# Patient Record
Sex: Female | Born: 1970 | Race: White | Hispanic: No | Marital: Married | Smoking: Never smoker
Health system: Southern US, Community
[De-identification: ages and names within clinical notes are randomized; demographics above are authoritative.]

## PROBLEM LIST (undated history)

## (undated) HISTORY — PX: TUBAL LIGATION: SHX77

---

## 1998-05-18 ENCOUNTER — Ambulatory Visit (HOSPITAL_COMMUNITY): Admission: RE | Admit: 1998-05-18 | Discharge: 1998-05-18 | Payer: Self-pay | Admitting: Obstetrics and Gynecology

## 1998-08-22 ENCOUNTER — Inpatient Hospital Stay (HOSPITAL_COMMUNITY): Admission: AD | Admit: 1998-08-22 | Discharge: 1998-08-22 | Payer: Self-pay | Admitting: Obstetrics and Gynecology

## 1998-08-23 ENCOUNTER — Inpatient Hospital Stay (HOSPITAL_COMMUNITY): Admission: AD | Admit: 1998-08-23 | Discharge: 1998-08-26 | Payer: Self-pay | Admitting: Obstetrics & Gynecology

## 1998-10-19 ENCOUNTER — Other Ambulatory Visit: Admission: RE | Admit: 1998-10-19 | Discharge: 1998-10-19 | Payer: Self-pay | Admitting: Obstetrics and Gynecology

## 2001-10-26 ENCOUNTER — Other Ambulatory Visit: Admission: RE | Admit: 2001-10-26 | Discharge: 2001-10-26 | Payer: Self-pay | Admitting: Obstetrics and Gynecology

## 2002-02-24 ENCOUNTER — Inpatient Hospital Stay (HOSPITAL_COMMUNITY): Admission: AD | Admit: 2002-02-24 | Discharge: 2002-02-24 | Payer: Self-pay | Admitting: Obstetrics and Gynecology

## 2002-04-25 ENCOUNTER — Inpatient Hospital Stay (HOSPITAL_COMMUNITY): Admission: AD | Admit: 2002-04-25 | Discharge: 2002-04-28 | Payer: Self-pay | Admitting: *Deleted

## 2002-05-31 ENCOUNTER — Other Ambulatory Visit: Admission: RE | Admit: 2002-05-31 | Discharge: 2002-05-31 | Payer: Self-pay | Admitting: Obstetrics and Gynecology

## 2003-07-20 ENCOUNTER — Other Ambulatory Visit: Admission: RE | Admit: 2003-07-20 | Discharge: 2003-07-20 | Payer: Self-pay | Admitting: Obstetrics and Gynecology

## 2003-11-24 ENCOUNTER — Ambulatory Visit (HOSPITAL_COMMUNITY): Admission: RE | Admit: 2003-11-24 | Discharge: 2003-11-24 | Payer: Self-pay | Admitting: *Deleted

## 2004-01-31 ENCOUNTER — Inpatient Hospital Stay (HOSPITAL_COMMUNITY): Admission: AD | Admit: 2004-01-31 | Discharge: 2004-02-02 | Payer: Self-pay | Admitting: Obstetrics and Gynecology

## 2004-03-14 ENCOUNTER — Other Ambulatory Visit: Admission: RE | Admit: 2004-03-14 | Discharge: 2004-03-14 | Payer: Self-pay | Admitting: Obstetrics and Gynecology

## 2012-12-23 ENCOUNTER — Other Ambulatory Visit: Payer: Self-pay | Admitting: Family Medicine

## 2012-12-23 DIAGNOSIS — L819 Disorder of pigmentation, unspecified: Secondary | ICD-10-CM

## 2012-12-23 DIAGNOSIS — N644 Mastodynia: Secondary | ICD-10-CM

## 2013-01-05 ENCOUNTER — Other Ambulatory Visit: Payer: Self-pay

## 2016-02-19 ENCOUNTER — Encounter (INDEPENDENT_AMBULATORY_CARE_PROVIDER_SITE_OTHER): Payer: Self-pay

## 2017-08-04 ENCOUNTER — Ambulatory Visit: Payer: Self-pay | Admitting: Cardiovascular Disease

## 2019-09-13 ENCOUNTER — Emergency Department (HOSPITAL_COMMUNITY)
Admission: EM | Admit: 2019-09-13 | Discharge: 2019-09-13 | Disposition: A | Discharge status: Home / Self Care | Payer: BC Managed Care – PPO | Attending: Emergency Medicine | Admitting: Emergency Medicine

## 2019-09-13 ENCOUNTER — Encounter (HOSPITAL_COMMUNITY): Payer: Self-pay | Admitting: Emergency Medicine

## 2019-09-13 ENCOUNTER — Other Ambulatory Visit: Payer: Self-pay

## 2019-09-13 ENCOUNTER — Emergency Department (HOSPITAL_COMMUNITY): Payer: BC Managed Care – PPO

## 2019-09-13 DIAGNOSIS — Z79899 Other long term (current) drug therapy: Secondary | ICD-10-CM | POA: Diagnosis not present

## 2019-09-13 DIAGNOSIS — R0789 Other chest pain: Secondary | ICD-10-CM | POA: Diagnosis present

## 2019-09-13 DIAGNOSIS — Z7982 Long term (current) use of aspirin: Secondary | ICD-10-CM | POA: Diagnosis not present

## 2019-09-13 LAB — CBC WITH DIFFERENTIAL/PLATELET
Abs Immature Granulocytes: 0.02 10*3/uL (ref 0.00–0.07)
Basophils Absolute: 0.1 10*3/uL (ref 0.0–0.1)
Basophils Relative: 1 %
Eosinophils Absolute: 0.4 10*3/uL (ref 0.0–0.5)
Eosinophils Relative: 6 %
HCT: 38.7 % (ref 36.0–46.0)
Hemoglobin: 13.4 g/dL (ref 12.0–15.0)
Immature Granulocytes: 0 %
Lymphocytes Relative: 27 %
Lymphs Abs: 1.8 10*3/uL (ref 0.7–4.0)
MCH: 31.2 pg (ref 26.0–34.0)
MCHC: 34.6 g/dL (ref 30.0–36.0)
MCV: 90.2 fL (ref 80.0–100.0)
Monocytes Absolute: 0.6 10*3/uL (ref 0.1–1.0)
Monocytes Relative: 9 %
Neutro Abs: 3.8 10*3/uL (ref 1.7–7.7)
Neutrophils Relative %: 57 %
Platelets: 239 10*3/uL (ref 150–400)
RBC: 4.29 MIL/uL (ref 3.87–5.11)
RDW: 13.2 % (ref 11.5–15.5)
WBC: 6.6 10*3/uL (ref 4.0–10.5)
nRBC: 0 % (ref 0.0–0.2)

## 2019-09-13 LAB — COMPREHENSIVE METABOLIC PANEL
ALT: 28 U/L (ref 0–44)
AST: 23 U/L (ref 15–41)
Albumin: 4.3 g/dL (ref 3.5–5.0)
Alkaline Phosphatase: 73 U/L (ref 38–126)
Anion gap: 7 (ref 5–15)
BUN: 12 mg/dL (ref 6–20)
CO2: 28 mmol/L (ref 22–32)
Calcium: 9 mg/dL (ref 8.9–10.3)
Chloride: 102 mmol/L (ref 98–111)
Creatinine, Ser: 0.88 mg/dL (ref 0.44–1.00)
GFR calc Af Amer: >60 mL/min (ref >60)
GFR calc non Af Amer: >60 mL/min (ref >60)
Glucose, Bld: 110 mg/dL — ABNORMAL HIGH (ref 70–99)
Potassium: 3.6 mmol/L (ref 3.5–5.1)
Sodium: 137 mmol/L (ref 135–145)
Total Bilirubin: 0.6 mg/dL (ref 0.3–1.2)
Total Protein: 7.1 g/dL (ref 6.5–8.1)

## 2019-09-13 LAB — TROPONIN I (HIGH SENSITIVITY)
Troponin I (High Sensitivity): 3 ng/L (ref <18)
Troponin I (High Sensitivity): 3 ng/L (ref <18)

## 2019-09-13 LAB — D-DIMER, QUANTITATIVE: D-Dimer, Quant: 0.37 ug/mL-FEU (ref 0.00–0.50)

## 2019-09-13 NOTE — ED Provider Notes (Signed)
[facility] EMERGENCY DEPARTMENT Provider Note   CSN: [id number] Arrival date & time: 09/13/19  1730     History Chief Complaint  Patient presents with   Chest Pain    Jocelyn Parrish is a 49 y.o. female with no known past medical history presents to emergency department today with chief complaint of intermittent chest pain x1 day.  Patient states the pain started last night.  She was laying in bed when the pain started.  She states it was located in the center of her chest and felt like a tightness, severity 5/10.  The tightness lasted approximately 30 to 40 minutes.  At that time she states she had radiation of pain to bilateral arms.  She was diaphoretic but states she is going through menopause currently and gets night sweats though unsure if it was her typical night sweats or not.  Pain eventually resolved without any intervention.  She denied any associated nausea, emesis, back pain, shortness of breath. Patient states while eating dinner tonight the chest pain returned.  Again it was located in the center of her chest but this time it felt like a burning sensation. She had radiation of pain to her arms again, pain severity 4/10. She took aspirin prior to arrival. Cardiac risk factors include family history of MI <age 70. She denies history of hypertension, hyperlipidemia, diabetes, tobacco use, prior CVA or MI.  Denies fever, chills, dyspnea on exertion, SOB, radiation to jaw or back, nausea, lower extremity edema.  History reviewed. No pertinent past medical history.  There are no problems to display for this patient.      OB History   No obstetric history on file.     No family history on file.  Social History   Tobacco Use   Smoking status: Never Smoker   Smokeless tobacco: Never Used  Substance Use Topics   Alcohol use: Never   Drug use: Never    Home Medications Prior to Admission medications   Medication Sig Start Date End Date Taking? Authorizing  Provider  aspirin EC 81 MG tablet Take 81 mg by mouth daily.   Yes [provider]  Calcium-Vitamins C & D (CALCIUM/C/D PO) Take 1 tablet by mouth daily.   Yes [provider]  Multiple Vitamin (MULTIVITAMIN WITH MINERALS) TABS tablet Take 1 tablet by mouth daily.   Yes [provider]    Allergies    Augmentin [amoxicillin-pot clavulanate] and Azithromycin  Review of Systems   Review of Systems  All other systems are reviewed and are negative for acute change except as noted in the HPI.   Physical Exam Updated Vital Signs BP 112/76    Pulse 75    Temp 98.3 F (36.8 C) (Oral)    Resp 16    Ht 5\' 6"  (1.676 m)    Wt 89.8 kg    SpO2 97%    BMI 31.96 kg/m   Physical Exam Vitals and nursing note reviewed.  Constitutional:      General: She is not in acute distress.    Appearance: She is not ill-appearing.  HENT:     Head: Normocephalic and atraumatic.     Right Ear: Tympanic membrane and external ear normal.     Left Ear: Tympanic membrane and external ear normal.     Nose: Nose normal.     Mouth/Throat:     Mouth: Mucous membranes are moist.     Pharynx: Oropharynx is clear.  Eyes:  General: No scleral icterus.       Right eye: No discharge.        Left eye: No discharge.     Extraocular Movements: Extraocular movements intact.     Conjunctiva/sclera: Conjunctivae normal.     Pupils: Pupils are equal, round, and reactive to light.  Neck:     Vascular: No JVD.  Cardiovascular:     Rate and Rhythm: Regular rhythm. Tachycardia present.     Pulses: Normal pulses.          Radial pulses are 2+ on the right side and 2+ on the left side.     Heart sounds: Normal heart sounds.     Comments: HR 100-106 during exam Pulmonary:     Comments: Lungs clear to auscultation in all fields. Symmetric chest rise. No wheezing, rales, or rhonchi. Abdominal:     Comments: Abdomen is soft, non-distended, and non-tender in all quadrants. No rigidity, no guarding.  No peritoneal signs.  Musculoskeletal:        General: Normal range of motion.     Cervical back: Normal range of motion.     Right lower leg: No edema.     Left lower leg: No edema.  Skin:    General: Skin is warm and dry.     Capillary Refill: Capillary refill takes less than 2 seconds.  Neurological:     Mental Status: She is oriented to person, place, and time.     GCS: GCS eye subscore is 4. GCS verbal subscore is 5. GCS motor subscore is 6.     Comments: Fluent speech, no facial droop.  Psychiatric:        Behavior: Behavior normal.       ED Results / Procedures / Treatments   Labs (all labs ordered are listed, but only abnormal results are displayed) Labs Reviewed  COMPREHENSIVE METABOLIC PANEL - Abnormal; Notable for the following components:      Result Value   Glucose, Bld 110 (*)    All other components within normal limits  CBC WITH DIFFERENTIAL/PLATELET  D-DIMER, QUANTITATIVE (NOT AT [facility])  TROPONIN I (HIGH SENSITIVITY)  TROPONIN I (HIGH SENSITIVITY)    EKG EKG Interpretation  Date/Time:  Tuesday September 13 2019 17:42:32 EST Ventricular Rate:  81 PR Interval:    QRS Duration: 100 QT Interval:  384 QTC Calculation: 446 R Axis:   22 Text Interpretation: Sinus rhythm Baseline wander in lead(s) V5 No old tracing to compare Confirmed by Daleen Bo [phone]) on 09/13/2019 7:46:49 PM   Radiology DG Chest Port 1 View  Result Date: 09/13/2019 CLINICAL DATA:  Chest pain EXAM: PORTABLE CHEST 1 VIEW COMPARISON:  None. FINDINGS: The heart size and mediastinal contours are within normal limits. Both lungs are clear. The visualized skeletal structures are unremarkable. IMPRESSION: No active disease. Electronically Signed   By: Donavan Foil M.D.   On: 09/13/2019 18:42    Procedures Procedures (including critical care time)  Medications Ordered in ED Medications - No data to display  ED Course  I have reviewed the triage vital signs and the nursing  notes.  Pertinent labs & imaging results that were available during my care of the patient were reviewed by me and considered in my medical decision making (see chart for details).    MDM Rules/Calculators/A&P                      Patient presents to the emergency department with chest pain.  Patient nontoxic appearing, in no apparent distress. Normotensive, slightly tachycardic 99-106 during exam. Fairly benign physical exam. DDX: ACS, pulmonary embolism, dissection, pneumothorax, effusion, infiltrate, arrhythmia, anemia, electrolyte derangement, MSK. Evaluation initiated with labs, EKG, and CXR. Patient on cardiac monitor.   Work-up in the ER unremarkable. Labs reviewed, no leukocytosis, anemia, or significant electrolyte abnormality.D-dimer negative.  CXR without infiltrate, effusion, pneumothorax, or fracture/dislocation.   Heart score 4.EKG without obvious ischemia, delta troponin negative, doubt ACS. With negative dimer doubt pulmonary embolism. Pain is not a tearing sensation, symmetric pulses, no widening of mediastinum on CXR, doubt dissection. Cardiac monitor reviewed, no notable arrhythmias or tachycardia. Patient has appeared hemodynamically stable throughout ER visit and appears safe for discharge with close PCP/cardiology follow up. I discussed results, treatment plan, need for PCP follow-up, and return precautions with the patient. Provided opportunity for questions, patient confirmed understanding and is in agreement with plan. Findings and plan of care discussed with supervising physician Dr. Effie Shy.   Portions of this note were generated with Scientist, clinical (histocompatibility and immunogenetics). Dictation errors may occur despite best attempts at proofreading.    Final Clinical Impression(s) / ED Diagnoses Final diagnoses:  Atypical chest pain    Rx / DC Orders ED Discharge Orders    None       Kathyrn Lass 09/13/19 2213    Mancel Bale, MD 09/17/19 [phone]

## 2019-09-13 NOTE — ED Triage Notes (Signed)
CP (burning sensation) had 1 episode last night, that same feeling came again today at 1645.  Took 324 asa

## 2019-09-13 NOTE — Discharge Instructions (Addendum)
You have been seen today for chest pain. Please read and follow all provided instructions. Return to the emergency room for worsening condition or new concerning symptoms.    1. Medications:  No new prescriptions. Continue usual home medications Take medications as prescribed. Please review all of the medicines and only take them if you do not have an allergy to them.   2. Treatment: rest, drink plenty of fluids  3. Follow Up:  Please follow up with primary care provider by scheduling an appointment as soon as possible for a visit     It is also a possibility that you have an allergic reaction to any of the medicines that you have been prescribed - Everybody reacts differently to medications and while MOST people have no trouble with most medicines, you may have a reaction such as nausea, vomiting, rash, swelling, shortness of breath. If this is the case, please stop taking the medicine immediately and contact your physician.  ?

## 2019-12-13 ENCOUNTER — Ambulatory Visit (INDEPENDENT_AMBULATORY_CARE_PROVIDER_SITE_OTHER): Payer: BC Managed Care – PPO

## 2019-12-13 ENCOUNTER — Ambulatory Visit
Admission: EM | Admit: 2019-12-13 | Discharge: 2019-12-13 | Disposition: A | Discharge status: Home / Self Care | Payer: BC Managed Care – PPO | Attending: Emergency Medicine | Admitting: Emergency Medicine

## 2019-12-13 ENCOUNTER — Other Ambulatory Visit: Payer: Self-pay

## 2019-12-13 ENCOUNTER — Encounter: Payer: Self-pay | Admitting: Emergency Medicine

## 2019-12-13 DIAGNOSIS — S62617A Displaced fracture of proximal phalanx of left little finger, initial encounter for closed fracture: Secondary | ICD-10-CM | POA: Diagnosis not present

## 2019-12-13 DIAGNOSIS — M79642 Pain in left hand: Secondary | ICD-10-CM

## 2019-12-13 DIAGNOSIS — M79645 Pain in left finger(s): Secondary | ICD-10-CM

## 2019-12-13 NOTE — ED Provider Notes (Addendum)
[facility]    CSN: [id number] Arrival date & time: 12/13/19  0853      History   Chief Complaint Chief Complaint  Patient presents with  . Finger Injury    HPI Jocelyn Parrish is a 49 y.o. female.   Who presented to the urgent care for complaint of left lower finger pain, swelling  for the past 2 days.  Occurred while playing volleyball.  She localizes the pain to the left little finger.  She describes the pain as constant and achy. She has tried OTC medications without relief.  Her symptoms are made worse with ROM.  She denies similar symptoms in the past.  Denies chills, fever, nausea, vomiting, diarrhea.  The history is provided by the patient. No language interpreter was used.    History reviewed. No pertinent past medical history.  There are no problems to display for this patient.   Past Surgical History:  Procedure Laterality Date  . TUBAL LIGATION      OB History   No obstetric history on file.      Home Medications    Prior to Admission medications   Medication Sig Start Date End Date Taking? Authorizing Provider  aspirin EC 81 MG tablet Take 81 mg by mouth daily.   Yes [provider]  Calcium-Vitamins C & D (CALCIUM/C/D PO) Take 1 tablet by mouth daily.   Yes [provider]  Multiple Vitamin (MULTIVITAMIN WITH MINERALS) TABS tablet Take 1 tablet by mouth daily.   Yes [provider]    Family History History reviewed. No pertinent family history.  Social History Social History   Tobacco Use  . Smoking status: Never Smoker  . Smokeless tobacco: Never Used  Substance Use Topics  . Alcohol use: Never  . Drug use: Never     Allergies   Augmentin [amoxicillin-pot clavulanate] and Azithromycin   Review of Systems Review of Systems  Constitutional: Negative.   Respiratory: Negative.   Cardiovascular: Negative.   Musculoskeletal: Positive for arthralgias.  All other systems reviewed and are negative.     Physical Exam Triage Vital Signs ED Triage Vitals  Enc Vitals Group     BP      Pulse      Resp      Temp      Temp src      SpO2      Weight      Height      Head Circumference      Peak Flow      Pain Score      Pain Loc      Pain Edu?      Excl. in GC?    No data found.  Updated Vital Signs BP 127/75 (BP Location: Right Arm)   Pulse 76   Temp 98 F (36.7 C) (Oral)   Resp 18   Ht 5\' 6"  (1.676 m)   Wt 198 lb (89.8 kg)   SpO2 98%   BMI 31.96 kg/m   Visual Acuity Right Eye Distance:   Left Eye Distance:   Bilateral Distance:    Right Eye Near:   Left Eye Near:    Bilateral Near:     Physical Exam Vitals and nursing note reviewed.  Constitutional:      General: She is not in acute distress.    Appearance: Normal appearance. She is normal weight. She is not ill-appearing, toxic-appearing or diaphoretic.  Cardiovascular:     Rate and  Rhythm: Normal rate and regular rhythm.     Pulses: Normal pulses.     Heart sounds: Normal heart sounds. No murmur. No friction rub. No gallop.   Pulmonary:     Effort: Pulmonary effort is normal. No respiratory distress.     Breath sounds: Normal breath sounds. No stridor. No wheezing, rhonchi or rales.  Chest:     Chest wall: No tenderness.  Musculoskeletal:        General: Swelling and tenderness present.     Right hand: Normal.     Left hand: Swelling and tenderness present.     Comments: The left hand is with obvious deformity when compared to the right hand.  Swelling  present.  No erythema, atrophy, surface trauma, open wound, nail avulsion, tissue avulsion, partial or complete amputation, subungual hematoma or bony deformity.  Normal cascade of finger.  Limited flexion and extension of finger.  Neurovascular status intact  Skin:    Findings: No bruising.  Neurological:     Mental Status: She is alert.      UC Treatments / Results  Labs (all labs ordered are listed, but only abnormal results are displayed)  Labs Reviewed - No data to display  EKG   Radiology DG Finger Little Left  Result Date: 12/13/2019 CLINICAL DATA:  Pain. EXAM: LEFT LITTLE FINGER 2+V COMPARISON:  None. FINDINGS: Normal alignment. Joint spaces are approximated. Small osseous fragments along the lateral proximal interphalangeal joint likely reflect posttraumatic sequela. Mild soft tissue swelling. IMPRESSION: Minimally displaced proximal phalangeal head fracture involving the lateral aspect. Mild soft tissue swelling. Electronically Signed   By: Primitivo Gauze M.D.   On: 12/13/2019 09:25    Procedures Procedures (including critical care time)  Medications Ordered in UC Medications - No data to display  Initial Impression / Assessment and Plan / UC Course  I have reviewed the triage vital signs and the nursing notes.  Pertinent labs & imaging results that were available during my care of the patient were reviewed by me and considered in my medical decision making (see chart for details).  Clinical Course as of Dec 12 940  Tue Dec 13, 2019  0916 DG Finger Little Left [KA]    Clinical Course User Index [KA] Emerson Monte, FNP   Patient is stable for discharge.    Left little finger x-ray is positive for displaced proximal phalangeal head fracture.  I have reviewed the x-ray myself and the radiologist interpretation.  I am in agreement with the radiologist interpretation.  She was advised to follow-up with orthopedic.  Currently taking OTC ibuprofen and it has been effective.  Declined Ibuprofen 800 mg.  Final Clinical Impressions(s) / UC Diagnoses   Final diagnoses:  Pain of finger of left hand  Displaced fracture of proximal phalanx of left little finger, initial encounter for closed fracture     Discharge Instructions     Continue to use finger splint Follow RICE instructions Use OTC Tylenol/ibuprofen as needed for pain management Follow-up with orthopedic Return or go to ED for worsening of  symptoms    ED Prescriptions    None     PDMP not reviewed this encounter.   Emerson Monte, FNP 12/13/19 [place]    Emerson Monte, FNP 12/13/19 [phone]

## 2019-12-13 NOTE — Discharge Instructions (Addendum)
Continue to use finger splint Follow RICE instructions Use OTC Tylenol/ibuprofen as needed for pain management Follow-up with orthopedic Return or go to ED for worsening of symptoms

## 2019-12-13 NOTE — ED Triage Notes (Signed)
Pain, swelling and bruising to LT pinky finger after getting hit while playing volleyball Sunday evening.

## 2020-10-31 ENCOUNTER — Other Ambulatory Visit: Payer: Self-pay | Admitting: Orthopedic Surgery

## 2020-11-05 ENCOUNTER — Encounter (HOSPITAL_BASED_OUTPATIENT_CLINIC_OR_DEPARTMENT_OTHER): Payer: Self-pay | Admitting: Orthopedic Surgery

## 2020-11-05 ENCOUNTER — Other Ambulatory Visit: Payer: Self-pay

## 2020-11-09 ENCOUNTER — Other Ambulatory Visit (HOSPITAL_COMMUNITY)
Admission: RE | Admit: 2020-11-09 | Discharge: 2020-11-09 | Disposition: A | Discharge status: Home / Self Care | Payer: BC Managed Care – PPO | Source: Ambulatory Visit | Attending: Orthopedic Surgery | Admitting: Orthopedic Surgery

## 2020-11-09 DIAGNOSIS — Z01812 Encounter for preprocedural laboratory examination: Secondary | ICD-10-CM | POA: Diagnosis present

## 2020-11-09 DIAGNOSIS — Z20822 Contact with and (suspected) exposure to covid-19: Secondary | ICD-10-CM | POA: Insufficient documentation

## 2020-11-09 NOTE — Progress Notes (Signed)

## 2020-11-10 LAB — SARS CORONAVIRUS 2 (TAT 6-24 HRS): SARS Coronavirus 2: NEGATIVE

## 2020-11-12 NOTE — Anesthesia Preprocedure Evaluation (Addendum)
Anesthesia Evaluation  Patient identified by MRN, date of birth, ID band Patient awake    Reviewed: Allergy & Precautions, NPO status , Patient's Chart, lab work & pertinent test results  Airway Mallampati: II  TM Distance: >3 FB Neck ROM: Full    Dental no notable dental hx.    Pulmonary neg pulmonary ROS,    Pulmonary exam normal breath sounds clear to auscultation       Cardiovascular negative cardio ROS Normal cardiovascular exam Rhythm:Regular Rate:Normal     Neuro/Psych negative neurological ROS  negative psych ROS   GI/Hepatic negative GI ROS, Neg liver ROS,   Endo/Other  negative endocrine ROS  Renal/GU negative Renal ROS  negative genitourinary   Musculoskeletal Mucoid tumor, arthritic DIP joint L middle finger    Abdominal   Peds  Hematology negative hematology ROS (+)   Anesthesia Other Findings   Reproductive/Obstetrics negative OB ROS                            Anesthesia Physical Anesthesia Plan  ASA: I  Anesthesia Plan: MAC and Bier Block and Bier Block-LIDOCAINE ONLY   Post-op Pain Management:    Induction:   PONV Risk Score and Plan: Propofol infusion, TIVA and Treatment may vary due to age or medical condition  Airway Management Planned: Natural Airway and Simple Face Mask  Additional Equipment: None  Intra-op Plan:   Post-operative Plan:   Informed Consent: I have reviewed the patients History and Physical, chart, labs and discussed the procedure including the risks, benefits and alternatives for the proposed anesthesia with the patient or authorized representative who has indicated his/her understanding and acceptance.       Plan Discussed with: CRNA  Anesthesia Plan Comments:       Anesthesia Quick Evaluation

## 2020-11-13 ENCOUNTER — Ambulatory Visit (HOSPITAL_BASED_OUTPATIENT_CLINIC_OR_DEPARTMENT_OTHER): Payer: BC Managed Care – PPO | Admitting: Anesthesiology

## 2020-11-13 ENCOUNTER — Ambulatory Visit (HOSPITAL_BASED_OUTPATIENT_CLINIC_OR_DEPARTMENT_OTHER)
Admission: RE | Admit: 2020-11-13 | Discharge: 2020-11-13 | Disposition: A | Discharge status: Home / Self Care | Payer: BC Managed Care – PPO | Attending: Orthopedic Surgery | Admitting: Orthopedic Surgery

## 2020-11-13 ENCOUNTER — Encounter (HOSPITAL_BASED_OUTPATIENT_CLINIC_OR_DEPARTMENT_OTHER)
Admission: RE | Disposition: A | Discharge status: Home / Self Care | Payer: Self-pay | Source: Home / Self Care | Attending: Orthopedic Surgery

## 2020-11-13 ENCOUNTER — Other Ambulatory Visit: Payer: Self-pay

## 2020-11-13 ENCOUNTER — Encounter (HOSPITAL_BASED_OUTPATIENT_CLINIC_OR_DEPARTMENT_OTHER): Payer: Self-pay | Admitting: Orthopedic Surgery

## 2020-11-13 DIAGNOSIS — Z833 Family history of diabetes mellitus: Secondary | ICD-10-CM | POA: Diagnosis not present

## 2020-11-13 DIAGNOSIS — M19042 Primary osteoarthritis, left hand: Secondary | ICD-10-CM | POA: Diagnosis present

## 2020-11-13 DIAGNOSIS — Z8349 Family history of other endocrine, nutritional and metabolic diseases: Secondary | ICD-10-CM | POA: Insufficient documentation

## 2020-11-13 DIAGNOSIS — M67442 Ganglion, left hand: Secondary | ICD-10-CM | POA: Insufficient documentation

## 2020-11-13 DIAGNOSIS — Z8261 Family history of arthritis: Secondary | ICD-10-CM | POA: Insufficient documentation

## 2020-11-13 DIAGNOSIS — Z880 Allergy status to penicillin: Secondary | ICD-10-CM | POA: Diagnosis not present

## 2020-11-13 DIAGNOSIS — Z881 Allergy status to other antibiotic agents status: Secondary | ICD-10-CM | POA: Insufficient documentation

## 2020-11-13 HISTORY — PX: EXCISION METACARPAL MASS: SHX6372

## 2020-11-13 SURGERY — EXCISION METACARPAL MASS
Anesthesia: Monitor Anesthesia Care | Site: Finger | Laterality: Left

## 2020-11-13 MED ORDER — CLINDAMYCIN PHOSPHATE 900 MG/50ML IV SOLN
INTRAVENOUS | Status: AC
  Filled 2020-11-13: qty 50

## 2020-11-13 MED ORDER — BUPIVACAINE HCL (PF) 0.25 % IJ SOLN
INTRAMUSCULAR | Status: DC | PRN
  Administered 2020-11-13: 9 mL

## 2020-11-13 MED ORDER — HYDROMORPHONE HCL 1 MG/ML IJ SOLN: 0.2500 mg | INTRAMUSCULAR | Status: DC | PRN

## 2020-11-13 MED ORDER — PROPOFOL 10 MG/ML IV BOLUS
INTRAVENOUS | Status: DC | PRN
  Administered 2020-11-13: 20 mg via INTRAVENOUS
  Administered 2020-11-13 (×2): 10 mg via INTRAVENOUS

## 2020-11-13 MED ORDER — KETOROLAC TROMETHAMINE 30 MG/ML IJ SOLN: 30.0000 mg | Freq: Once | INTRAMUSCULAR | Status: DC | PRN

## 2020-11-13 MED ORDER — TRAMADOL HCL 50 MG PO TABS: 50.0000 mg | ORAL_TABLET | Freq: Four times a day (QID) | ORAL | 0 refills | Status: AC | PRN

## 2020-11-13 MED ORDER — ACETAMINOPHEN 500 MG PO TABS
ORAL_TABLET | ORAL | Status: AC
  Filled 2020-11-13: qty 2

## 2020-11-13 MED ORDER — PROMETHAZINE HCL 25 MG/ML IJ SOLN: 6.2500 mg | INTRAMUSCULAR | Status: DC | PRN

## 2020-11-13 MED ORDER — ACETAMINOPHEN 500 MG PO TABS
1000.0000 mg | ORAL_TABLET | Freq: Once | ORAL | Status: AC
  Administered 2020-11-13: 1000 mg via ORAL

## 2020-11-13 MED ORDER — CLINDAMYCIN PHOSPHATE 900 MG/50ML IV SOLN
900.0000 mg | INTRAVENOUS | Status: AC
  Administered 2020-11-13: 900 mg via INTRAVENOUS

## 2020-11-13 MED ORDER — EPHEDRINE 5 MG/ML INJ
INTRAVENOUS | Status: AC
  Filled 2020-11-13: qty 10

## 2020-11-13 MED ORDER — OXYCODONE HCL 5 MG/5ML PO SOLN: 5.0000 mg | Freq: Once | ORAL | Status: DC | PRN

## 2020-11-13 MED ORDER — FENTANYL CITRATE (PF) 100 MCG/2ML IJ SOLN
INTRAMUSCULAR | Status: AC
  Filled 2020-11-13: qty 2

## 2020-11-13 MED ORDER — OXYCODONE HCL 5 MG PO TABS
5.0000 mg | ORAL_TABLET | Freq: Once | ORAL | Status: DC | PRN
Start: 2020-11-13 — End: 2020-11-13

## 2020-11-13 MED ORDER — FENTANYL CITRATE (PF) 100 MCG/2ML IJ SOLN
INTRAMUSCULAR | Status: DC | PRN
  Administered 2020-11-13: 25 ug via INTRAVENOUS

## 2020-11-13 MED ORDER — MIDAZOLAM HCL 5 MG/5ML IJ SOLN
INTRAMUSCULAR | Status: DC | PRN
  Administered 2020-11-13: 2 mg via INTRAVENOUS

## 2020-11-13 MED ORDER — PROPOFOL 500 MG/50ML IV EMUL
INTRAVENOUS | Status: DC | PRN
  Administered 2020-11-13: 75 ug/kg/min via INTRAVENOUS

## 2020-11-13 MED ORDER — EPHEDRINE SULFATE 50 MG/ML IJ SOLN
INTRAMUSCULAR | Status: DC | PRN
  Administered 2020-11-13: 15 mg via INTRAVENOUS

## 2020-11-13 MED ORDER — LIDOCAINE HCL (PF) 0.5 % IJ SOLN
INTRAMUSCULAR | Status: DC | PRN
  Administered 2020-11-13: 30 mL via INTRAVENOUS

## 2020-11-13 MED ORDER — LIDOCAINE HCL (PF) 1 % IJ SOLN
INTRAMUSCULAR | Status: DC | PRN
  Administered 2020-11-13: 6 mL

## 2020-11-13 MED ORDER — LACTATED RINGERS IV SOLN: INTRAVENOUS | Status: DC

## 2020-11-13 MED ORDER — ONDANSETRON HCL 4 MG/2ML IJ SOLN
INTRAMUSCULAR | Status: DC | PRN
  Administered 2020-11-13: 4 mg via INTRAVENOUS

## 2020-11-13 MED ORDER — MIDAZOLAM HCL 2 MG/2ML IJ SOLN
INTRAMUSCULAR | Status: AC
  Filled 2020-11-13: qty 2

## 2020-11-13 MED ORDER — LIDOCAINE HCL (PF) 1 % IJ SOLN
INTRAMUSCULAR | Status: AC
  Filled 2020-11-13: qty 30

## 2020-11-13 SURGICAL SUPPLY — 52 items
APL PRP STRL LF DISP 70% ISPRP (MISCELLANEOUS) ×1
BLADE MINI RND TIP GREEN BEAV (BLADE) IMPLANT
BLADE SURG 15 STRL LF DISP TIS (BLADE) ×1 IMPLANT
BLADE SURG 15 STRL SS (BLADE) ×2
BNDG CMPR 9X4 STRL LF SNTH (GAUZE/BANDAGES/DRESSINGS) ×1
BNDG COHESIVE 1X5 TAN STRL LF (GAUZE/BANDAGES/DRESSINGS) ×1 IMPLANT
BNDG COHESIVE 2X5 TAN STRL LF (GAUZE/BANDAGES/DRESSINGS) IMPLANT
BNDG COHESIVE 3X5 TAN STRL LF (GAUZE/BANDAGES/DRESSINGS) IMPLANT
BNDG ESMARK 4X9 LF (GAUZE/BANDAGES/DRESSINGS) ×1 IMPLANT
BNDG GAUZE ELAST 4 BULKY (GAUZE/BANDAGES/DRESSINGS) IMPLANT
CHLORAPREP W/TINT 26 (MISCELLANEOUS) ×2 IMPLANT
CORD BIPOLAR FORCEPS 12FT (ELECTRODE) ×2 IMPLANT
COVER BACK TABLE 60X90IN (DRAPES) ×2 IMPLANT
COVER MAYO STAND STRL (DRAPES) ×2 IMPLANT
COVER WAND RF STERILE (DRAPES) IMPLANT
CUFF TOURN SGL QUICK 18X4 (TOURNIQUET CUFF) ×1 IMPLANT
DECANTER SPIKE VIAL GLASS SM (MISCELLANEOUS) ×1 IMPLANT
DRAIN PENROSE 1/2X12 LTX STRL (WOUND CARE) IMPLANT
DRAPE EXTREMITY T 121X128X90 (DISPOSABLE) ×2 IMPLANT
DRAPE SURG 17X23 STRL (DRAPES) ×2 IMPLANT
GAUZE SPONGE 4X4 12PLY STRL (GAUZE/BANDAGES/DRESSINGS) ×2 IMPLANT
GAUZE XEROFORM 1X8 LF (GAUZE/BANDAGES/DRESSINGS) ×2 IMPLANT
GLOVE SURG ENC MOIS LTX SZ6.5 (GLOVE) ×2 IMPLANT
GLOVE SURG ORTHO LTX SZ8 (GLOVE) ×2 IMPLANT
GLOVE SURG UNDER POLY LF SZ7 (GLOVE) ×3 IMPLANT
GLOVE SURG UNDER POLY LF SZ8.5 (GLOVE) ×2 IMPLANT
GOWN STRL REUS W/ TWL LRG LVL3 (GOWN DISPOSABLE) ×1 IMPLANT
GOWN STRL REUS W/ TWL XL LVL3 (GOWN DISPOSABLE) IMPLANT
GOWN STRL REUS W/TWL LRG LVL3 (GOWN DISPOSABLE) ×2
GOWN STRL REUS W/TWL XL LVL3 (GOWN DISPOSABLE) ×4 IMPLANT
NDL PRECISIONGLIDE 27X1.5 (NEEDLE) IMPLANT
NDL SAFETY ECLIPSE 18X1.5 (NEEDLE) ×1 IMPLANT
NEEDLE HYPO 18GX1.5 SHARP (NEEDLE)
NEEDLE PRECISIONGLIDE 27X1.5 (NEEDLE) ×2 IMPLANT
NS IRRIG 1000ML POUR BTL (IV SOLUTION) ×2 IMPLANT
PACK BASIN DAY SURGERY FS (CUSTOM PROCEDURE TRAY) ×2 IMPLANT
PAD CAST 3X4 CTTN HI CHSV (CAST SUPPLIES) IMPLANT
PADDING CAST ABS 3INX4YD NS (CAST SUPPLIES)
PADDING CAST ABS 4INX4YD NS (CAST SUPPLIES)
PADDING CAST ABS COTTON 3X4 (CAST SUPPLIES) IMPLANT
PADDING CAST ABS COTTON 4X4 ST (CAST SUPPLIES) ×1 IMPLANT
PADDING CAST COTTON 3X4 STRL (CAST SUPPLIES)
SPLINT FINGER 3.25 BULB 911905 (SOFTGOODS) ×1 IMPLANT
SPLINT PLASTER CAST XFAST 3X15 (CAST SUPPLIES) IMPLANT
SPLINT PLASTER XTRA FASTSET 3X (CAST SUPPLIES)
STOCKINETTE 4X48 STRL (DRAPES) ×2 IMPLANT
SUT ETHILON 4 0 PS 2 18 (SUTURE) ×2 IMPLANT
SUT VIC AB 4-0 P2 18 (SUTURE) IMPLANT
SYR BULB EAR ULCER 3OZ GRN STR (SYRINGE) ×2 IMPLANT
SYR CONTROL 10ML LL (SYRINGE) ×1 IMPLANT
TOWEL GREEN STERILE FF (TOWEL DISPOSABLE) ×3 IMPLANT
UNDERPAD 30X36 HEAVY ABSORB (UNDERPADS AND DIAPERS) ×1 IMPLANT

## 2020-11-13 NOTE — Brief Op Note (Signed)
11/13/2020  12:20 PM  PATIENT:  Jocelyn Parrish  50 y.o. 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  PROCEDURE:  Procedure(s): EXCISION MASS, DEBRIDEMENT/SYNOVECTOMY DISTAL INTERPHALANGEAL JOINT, POSSIBLE ROTATION FLAP LEFT MIDDLE FINGER (Left)  SURGEON:  Surgeon(s) and Role:    * Cindee Salt, MD - Primary  PHYSICIAN ASSISTANT:   ASSISTANTS: none   ANESTHESIA:   local, regional and IV sedation  EBL:  37ml   BLOOD ADMINISTERED:none  DRAINS: none   LOCAL MEDICATIONS USED:  BUPIVICAINE  and XYLOCAINE   SPECIMEN:  Excision  DISPOSITION OF SPECIMEN:  PATHOLOGY  COUNTS:  YES  TOURNIQUET:   Total Tourniquet Time Documented: Forearm (Left) - 30 minutes Total: Forearm (Left) - 30 minutes   DICTATION: .Dragon Dictation  PLAN OF CARE: Discharge to home after PACU  PATIENT DISPOSITION:  PACU - hemodynamically stable.

## 2020-11-13 NOTE — Discharge Instructions (Signed)
May take Tylenol after 5pm, if needed.    Post Anesthesia Home Care Instructions  Activity: Get plenty of rest for the remainder of the day. A responsible individual must stay with you for 24 hours following the procedure.  For the next 24 hours, DO NOT: -Drive a car -Operate machinery -Drink alcoholic beverages -Take any medication unless instructed by your physician -Make any legal decisions or sign important papers.  Meals: Start with liquid foods such as gelatin or soup. Progress to regular foods as tolerated. Avoid greasy, spicy, heavy foods. If nausea and/or vomiting occur, drink only clear liquids until the nausea and/or vomiting subsides. Call your physician if vomiting continues.  Special Instructions/Symptoms: Your throat may feel dry or sore from the anesthesia or the breathing tube placed in your throat during surgery. If this causes discomfort, gargle with warm salt water. The discomfort should disappear within 24 hours.  If you had a scopolamine patch placed behind your ear for the management of post- operative nausea and/or vomiting:  1. The medication in the patch is effective for 72 hours, after which it should be removed.  Wrap patch in a tissue and discard in the trash. Wash hands thoroughly with soap and water. 2. You may remove the patch earlier than 72 hours if you experience unpleasant side effects which may include dry mouth, dizziness or visual disturbances. 3. Avoid touching the patch. Wash your hands with soap and water after contact with the patch.     

## 2020-11-13 NOTE — Transfer of Care (Signed)
Immediate Anesthesia Transfer of Care Note  Patient: Jocelyn Parrish  Procedure(s) Performed: EXCISION MASS, DEBRIDEMENT/SYNOVECTOMY DISTAL INTERPHALANGEAL JOINT, POSSIBLE ROTATION FLAP LEFT MIDDLE FINGER (Left Finger)  Patient Location: PACU  Anesthesia Type:MAC and Bier block  Level of Consciousness: drowsy and patient cooperative  Airway & Oxygen Therapy: Patient Spontanous Breathing and Patient connected to face mask oxygen  Post-op Assessment: Report given to RN and Post -op Vital signs reviewed and stable  Post vital signs: Reviewed and stable  Last Vitals:  Vitals Value Taken Time  BP 104/63 11/13/20 1225  Temp    Pulse 90 11/13/20 1226  Resp 14 11/13/20 1226  SpO2 98 % 11/13/20 1226  Vitals shown include unvalidated device data.  Last Pain:  Vitals:   11/13/20 1054  TempSrc: Oral  PainSc: 0-No pain         Complications: No complications documented.

## 2020-11-13 NOTE — H&P (Signed)
Jocelyn Parrish is an 50 y.o. female.   Chief Complaint: mass left middle finger Jocelyn Parrish is a 50 year old ambidextrous female former patient who has not been seen in 7 years. She is referred by Dr. Margo Aye for consultation regarding a mass on the dorsal aspect distal phalangeal joint left middle finger. This been present for approximately 4 months she states approximately 3 to 4 weeks ago she bumped it and it opened. It is healed over. He states it drained a clear fluid. She has no history of injury. She is not taking anything for it at the present time. She has a history of arthritis no history of thyroid problems diabetes or gout. Family history is positive for thyroid problems arthritis and diabetes. She has been tested is not presently taking anything for. She states nothing makes it better or worse     History reviewed. No pertinent past medical history.  Past Surgical History:  Procedure Laterality Date  . TUBAL LIGATION      History reviewed. No pertinent family history. Social History:  reports that she has never smoked. She has never used smokeless tobacco. She reports that she does not drink alcohol and does not use drugs.  Allergies:  Allergies  Allergen Reactions  . Augmentin [Amoxicillin-Pot Clavulanate] Rash  . Azithromycin Rash    No medications prior to admission.    No results found for this or any previous visit (from the past 48 hour(s)).  No results found.   Pertinent items are noted in HPI.  Height 5\' 6"  (1.676 m), weight 88 kg.  General appearance: alert, cooperative and appears stated age Head: Normocephalic, without obvious abnormality Neck: no JVD Resp: clear to auscultation bilaterally Cardio: regular rate and rhythm, S1, S2 normal, no murmur, click, rub or gallop GI: soft, non-tender; bowel sounds normal; no masses,  no organomegaly Extremities: mass left middle finger Pulses: 2+ and symmetric Skin: Skin color, texture, turgor normal. 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 Recommend surgical excision with the possibility of rotational flap following debridement of the joint. Prepare postoperative course are discussed along with risk complications. She is aware there is no guarantee to the surgery the possibility of infection recurrence injury to arteries nerves tendons stiffness to the joint. She is scheduled for excision mucoid cyst debridement distal phalangeal joint possible rotation flap left middle finger as an outpatient under regional anesthesia     11/13/2020, 8:07 AM

## 2020-11-13 NOTE — Anesthesia Postprocedure Evaluation (Signed)
Anesthesia Post Note  Patient: Jocelyn Parrish  Procedure(s) Performed: EXCISION MASS, DEBRIDEMENT/SYNOVECTOMY DISTAL INTERPHALANGEAL JOINT, POSSIBLE ROTATION FLAP LEFT MIDDLE FINGER (Left Finger)     Patient location during evaluation: PACU Anesthesia Type: MAC and Bier Block Level of consciousness: awake and alert Pain management: pain level controlled Vital Signs Assessment: post-procedure vital signs reviewed and stable Respiratory status: spontaneous breathing, nonlabored ventilation and respiratory function stable Cardiovascular status: blood pressure returned to baseline and stable Postop Assessment: no apparent nausea or vomiting Anesthetic complications: no   No complications documented.  Last Vitals:  Vitals:   11/13/20 1233 11/13/20 1240  BP:  113/67  Pulse:  82  Resp:  16  Temp:    SpO2: 100% 94%    Last Pain:  Vitals:   11/13/20 1240  TempSrc:   PainSc: 0-No pain                 Pervis Hocking

## 2020-11-13 NOTE — Anesthesia Procedure Notes (Signed)
Anesthesia Regional Block: Bier block (IV Regional)   Pre-Anesthetic Checklist: ,, timeout performed, Correct Patient, Correct Site, Correct Laterality, Correct Procedure, Correct Position, site marked, Risks and benefits discussed, Surgical consent,  Pre-op evaluation,  At surgeon's request  Laterality: Left  Prep: alcohol swabs        Procedures:,,,,,, Esmarch exsanguination, single tourniquet utilized,  Narrative:  Start time: 11/13/2020 11:47 AM End time: 11/13/2020 11:49 AM Injection made incrementally with aspirations every 30 mL. CRNA: Thornell Mule, CRNA

## 2020-11-13 NOTE — Op Note (Signed)
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   ASSISTANT: none   ANESTHESIA:  Bier block with sedation and Local   INTRAVENOUS FLUIDS:  Per anesthesia flow sheet.   ESTIMATED BLOOD LOSS:  Minimal.   COMPLICATIONS:  None.   SPECIMENS:   Osteophytes cyst and synovial tissue   TOURNIQUET TIME:    Total Tourniquet Time Documented: Forearm (Left) - 30 minutes Total: Forearm (Left) - 30 minutes    DISPOSITION:  Stable to PACU.   INDICATIONS: Patient is a 50 year old female with a history of a mass on the dorsal aspect distal phalangeal joint left middle finger.  This is opened up the past draining clear fluid.  X-rays reveal degenerative changes of the distal phalangeal joint middle phalanx she has elected to undergo surgical excision with debridement of the joint.  Preperi-and postoperative course been discussed along with risk complications.  She is aware that there is no guarantee to the surgery the possibility of infection recurrence injury to arteries nerves tendons incomplete relief symptoms dystrophy.  In preoperative area the patient is seen the extremity marked by both patient and surgeon antibiotic given  OPERATIVE COURSE: Patient is brought to the operating room where form based IV regional anesthetic was carried out without difficulty under the direction of the anesthesia department.  She was prepped with ChloraPrep in the supine position with the left arm free.  A 3-minute dry time was allowed timeout taken to confirm  patient procedure.  A metacarpal block was given with quarter percent bupivacaine and 1% Xylocaine both without epinephrine.  A curvilinear incision was made through the mass distally over the distal phalangeal joint on the mid lateral line carried down through subcutaneous tissue the cyst was immediately encountered with blunt sharp dissection this was dissected free and sent to pathology.  An incision was made just lateral to the extensor tendon to its insertion on the distal phalanx allowing opening of the joint.  Osteophyte then a synovectomy was then performed with a hemostatic rondure and a house curette.  Specimen was sent to pathology.  The translucent skin was excised with sharp dissection and the wound irrigated.  The skin was able to be closed with interrupted 4-0 nylon sutures.  Sterile compressive dressing splint to the finger was applied.  Deflation of the tourniquet remaining fingers pink.  She was taken to the recovery room for observation in satisfactory condition.  She will be discharged home to return to the [facility] in 1 week on Tylenol ibuprofen for pain with Ultram for breakthrough.   Cindee Salt, MD Electronically signed, 11/13/20

## 2020-11-13 NOTE — Anesthesia Procedure Notes (Signed)
Date/Time: 11/13/2020 11:43 AM Performed by: Thornell Mule, CRNA Oxygen Delivery Method: Simple face mask

## 2020-11-14 ENCOUNTER — Encounter (HOSPITAL_BASED_OUTPATIENT_CLINIC_OR_DEPARTMENT_OTHER): Payer: Self-pay | Admitting: Orthopedic Surgery

## 2020-11-14 LAB — SURGICAL PATHOLOGY

## 2020-11-26 ENCOUNTER — Encounter (HOSPITAL_BASED_OUTPATIENT_CLINIC_OR_DEPARTMENT_OTHER): Payer: Self-pay | Admitting: Orthopedic Surgery

## 2021-08-04 IMAGING — DX DG CHEST 1V PORT
1 series · 1 of 1 positions shown · non-contrast
Comparison: None.

CLINICAL DATA: Chest pain

EXAM:
PORTABLE CHEST 1 VIEW

[chest ap]
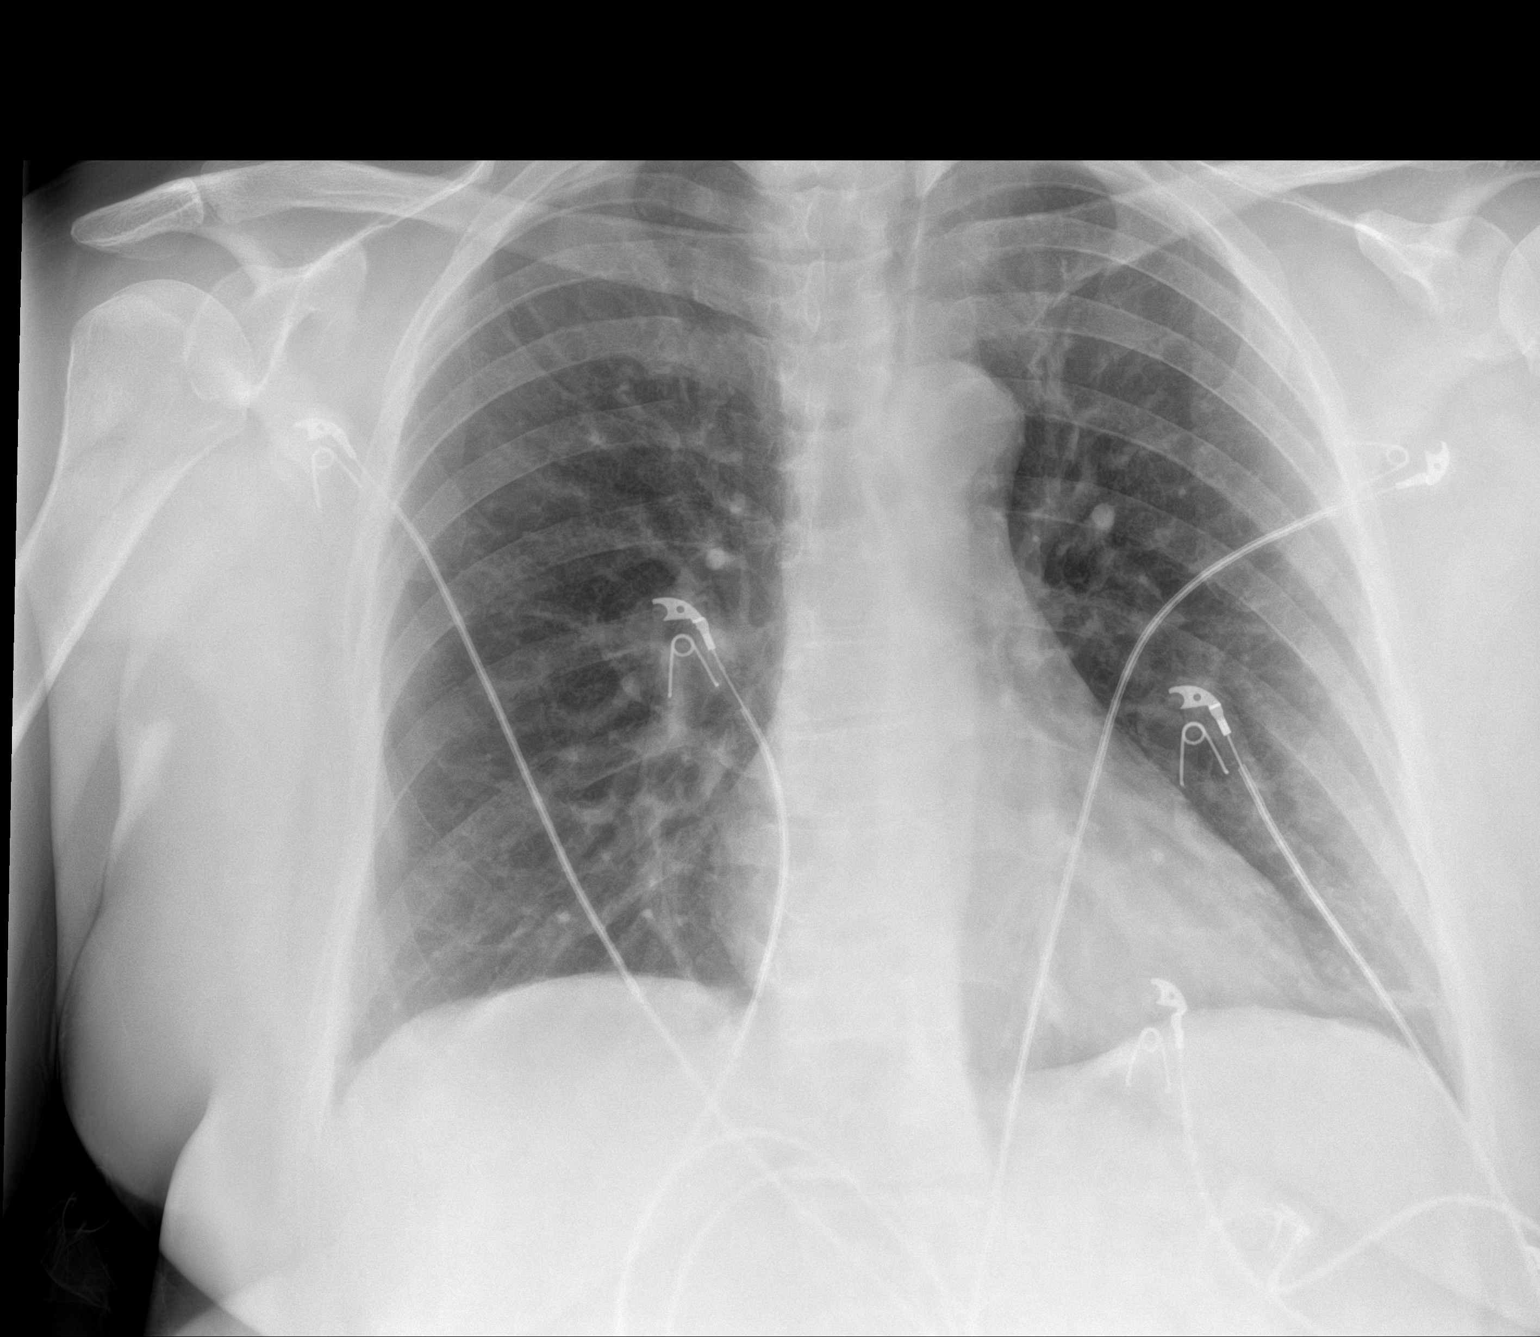

[1 of 1 positions shown; findings below may reference images not displayed]

FINDINGS: The heart size and mediastinal contours are within normal limits.
Both lungs are clear. The visualized skeletal structures are
unremarkable.
IMPRESSION: No active disease.

## 2022-06-28 ENCOUNTER — Ambulatory Visit
Admission: EM | Admit: 2022-06-28 | Discharge: 2022-06-28 | Disposition: A | Discharge status: Home / Self Care | Payer: BC Managed Care – PPO | Attending: Family Medicine | Admitting: Family Medicine

## 2022-06-28 DIAGNOSIS — R059 Cough, unspecified: Secondary | ICD-10-CM | POA: Diagnosis present

## 2022-06-28 DIAGNOSIS — J069 Acute upper respiratory infection, unspecified: Secondary | ICD-10-CM | POA: Insufficient documentation

## 2022-06-28 DIAGNOSIS — Z1152 Encounter for screening for COVID-19: Secondary | ICD-10-CM | POA: Insufficient documentation

## 2022-06-28 LAB — RESP PANEL BY RT-PCR (FLU A&B, COVID) ARPGX2
Influenza A by PCR: NEGATIVE
Influenza B by PCR: NEGATIVE
SARS Coronavirus 2 by RT PCR: NEGATIVE

## 2022-06-28 MED ORDER — PROMETHAZINE-DM 6.25-15 MG/5ML PO SYRP: 5.0000 mL | ORAL_SOLUTION | Freq: Four times a day (QID) | ORAL | 0 refills | Status: AC | PRN

## 2022-06-28 MED ORDER — FLUTICASONE PROPIONATE 50 MCG/ACT NA SUSP: 1.0000 | Freq: Two times a day (BID) | NASAL | 2 refills | Status: AC

## 2022-06-28 NOTE — ED Provider Notes (Signed)
[facility]    CSN: [id number] Arrival date & time: 06/28/22  0806      History   Chief Complaint No chief complaint on file.   HPI Jocelyn Parrish is a 51 y.o. female.   Presenting today with 6-day history of sore throat, nasal congestion, fevers off-and-on, body aches, fatigue, hacking cough.  Denies chest pain, shortness of breath, abdominal pain, vomiting, diarrhea.  Taking ibuprofen and DayQuil and NyQuil with mild temporary relief of symptoms.  Multiple sick contacts recently.    History reviewed. No pertinent past medical history.  There are no problems to display for this patient.   Past Surgical History:  Procedure Laterality Date   EXCISION METACARPAL MASS Left 11/13/2020   Procedure: EXCISION MASS, DEBRIDEMENT/SYNOVECTOMY DISTAL INTERPHALANGEAL JOINT;  Surgeon: Cindee Salt, MD;  Location: [facility];  Service: Orthopedics;  Laterality: Left;   TUBAL LIGATION      OB History   No obstetric history on file.      Home Medications    Prior to Admission medications   Medication Sig Start Date End Date Taking? Authorizing Provider  fluticasone (FLONASE) 50 MCG/ACT nasal spray Place 1 spray into both nostrils 2 (two) times daily. 06/28/22  Yes Particia Nearing, PA-C  promethazine-dextromethorphan (PROMETHAZINE-DM) 6.25-15 MG/5ML syrup Take 5 mLs by mouth 4 (four) times daily as needed. 06/28/22  Yes Particia Nearing, PA-C  aspirin EC 81 MG tablet Take 81 mg by mouth daily.    [provider]  Calcium-Vitamins C & D (CALCIUM/C/D PO) Take 1 tablet by mouth daily.    [provider]  Multiple Vitamin (MULTIVITAMIN WITH MINERALS) TABS tablet Take 1 tablet by mouth daily.    [provider]  traMADol (ULTRAM) 50 MG tablet Take 1 tablet (50 mg total) by mouth every 6 (six) hours as needed. 11/13/20   Cindee Salt, MD    Family History History reviewed. No pertinent family history.  Social History Social  History   Tobacco Use   Smoking status: Never   Smokeless tobacco: Never  Vaping Use   Vaping Use: Never used  Substance Use Topics   Alcohol use: Never   Drug use: Never     Allergies   Augmentin [amoxicillin-pot clavulanate] and Azithromycin   Review of Systems Review of Systems Per HPI  Physical Exam Triage Vital Signs ED Triage Vitals  Enc Vitals Group     BP 06/28/22 0821 139/79     Pulse Rate 06/28/22 0821 96     Resp 06/28/22 0821 18     Temp 06/28/22 0821 98.1 F (36.7 C)     Temp Source 06/28/22 0821 Oral     SpO2 06/28/22 0821 98 %     Weight --      Height --      Head Circumference --      Peak Flow --      Pain Score 06/28/22 0825 0     Pain Loc --      Pain Edu? --      Excl. in GC? --    No data found.  Updated Vital Signs BP 139/79 (BP Location: Right Arm)   Pulse 96   Temp 98.1 F (36.7 C) (Oral)   Resp 18   SpO2 98%   Visual Acuity Right Eye Distance:   Left Eye Distance:   Bilateral Distance:    Right Eye Near:   Left Eye Near:    Bilateral Near:  Physical Exam Vitals and nursing note reviewed.  Constitutional:      Appearance: Normal appearance.  HENT:     Head: Atraumatic.     Right Ear: Tympanic membrane and external ear normal.     Left Ear: Tympanic membrane and external ear normal.     Nose: Rhinorrhea present.     Mouth/Throat:     Mouth: Mucous membranes are moist.     Pharynx: Posterior oropharyngeal erythema present.  Eyes:     Extraocular Movements: Extraocular movements intact.     Conjunctiva/sclera: Conjunctivae normal.  Cardiovascular:     Rate and Rhythm: Normal rate and regular rhythm.     Heart sounds: Normal heart sounds.  Pulmonary:     Effort: Pulmonary effort is normal.     Breath sounds: Normal breath sounds. No wheezing or rales.  Musculoskeletal:        General: Normal range of motion.     Cervical back: Normal range of motion and neck supple.  Skin:    General: Skin is warm and dry.   Neurological:     Mental Status: She is alert and oriented to person, place, and time.  Psychiatric:        Mood and Affect: Mood normal.        Thought Content: Thought content normal.      UC Treatments / Results  Labs (all labs ordered are listed, but only abnormal results are displayed) Labs Reviewed  RESP PANEL BY RT-PCR (FLU A&B, COVID) [id number]    EKG   Radiology No results found.  Procedures Procedures (including critical care time)  Medications Ordered in UC Medications - No data to display  Initial Impression / Assessment and Plan / UC Course  I have reviewed the triage vital signs and the nursing notes.  Pertinent labs & imaging results that were available during my care of the patient were reviewed by me and considered in my medical decision making (see chart for details).     Vital signs and exam reassuring and suspicious for viral upper respiratory infection.  Respiratory panel pending, treat with Phenergan DM, Flonase, supportive care and return precautions reviewed.  Work note given.  Final Clinical Impressions(s) / UC Diagnoses   Final diagnoses:  Viral URI with cough   Discharge Instructions   None    ED Prescriptions     Medication Sig Dispense Auth. Provider   promethazine-dextromethorphan (PROMETHAZINE-DM) 6.25-15 MG/5ML syrup Take 5 mLs by mouth 4 (four) times daily as needed. 100 mL Particia Nearing, PA-C   fluticasone [facility]) 50 MCG/ACT nasal spray Place 1 spray into both nostrils 2 (two) times daily. 16 g Particia Nearing, [place]      PDMP not reviewed this encounter.   Roosvelt Maser [place], [place] 06/28/22 [phone]

## 2022-06-28 NOTE — ED Triage Notes (Signed)
Pt reports a sore throat, mucus, low fever last night x 6 days. Took ibuprofen, otc cold and flu meds, nyquil which gave slight relief.

## 2023-12-21 ENCOUNTER — Ambulatory Visit
Admission: EM | Admit: 2023-12-21 | Discharge: 2023-12-21 | Disposition: A | Discharge status: Home / Self Care | Attending: Family Medicine | Admitting: Family Medicine

## 2023-12-21 DIAGNOSIS — Z23 Encounter for immunization: Secondary | ICD-10-CM

## 2023-12-21 DIAGNOSIS — S61210A Laceration without foreign body of right index finger without damage to nail, initial encounter: Secondary | ICD-10-CM | POA: Diagnosis not present

## 2023-12-21 MED ORDER — TETANUS-DIPHTH-ACELL PERTUSSIS 5-2.5-18.5 LF-MCG/0.5 IM SUSY
0.5000 mL | PREFILLED_SYRINGE | Freq: Once | INTRAMUSCULAR | Status: AC
  Administered 2023-12-21: 0.5 mL via INTRAMUSCULAR

## 2023-12-21 MED ORDER — DOXYCYCLINE HYCLATE 100 MG PO CAPS: 100.0000 mg | ORAL_CAPSULE | Freq: Two times a day (BID) | ORAL | 0 refills | Status: AC

## 2023-12-21 MED ORDER — CHLORHEXIDINE GLUCONATE 4 % EX SOLN: Freq: Every day | CUTANEOUS | 0 refills | Status: AC | PRN

## 2023-12-21 MED ORDER — MUPIROCIN 2 % EX OINT: 1.0000 | TOPICAL_OINTMENT | Freq: Two times a day (BID) | CUTANEOUS | 0 refills | Status: AC

## 2023-12-21 NOTE — Discharge Instructions (Addendum)
 Clean the area at least once a day with Hibiclens solution and apply the mupirocin ointment and a nonstick gauze pad.  You may wrap the area in some Coban wrap to hold the dressing in place.  You may apply a finger splint for protection and to keep the area in a neutral position while healing.  I have given a course of antibiotics to ensure the area does not become infected while healing.  Elevate at rest to help with swelling, ibuprofen and Tylenol  as needed for pain.  Follow-up for significantly worsening symptoms at any point.

## 2023-12-21 NOTE — ED Provider Notes (Signed)
 [facility]    CSN: [id number] Arrival date & time: 12/21/23  1621      History   Chief Complaint Chief Complaint  Patient presents with   Laceration    HPI Jocelyn Parrish is a 53 y.o. female.   Patient presenting today with a laceration to the distal right pointer finger that occurred about an hour ago when she was opening a can of beans.  She denies decreased range of motion, severe pain, numbness, tingling, uncontrolled bleeding.  Not currently on any anticoagulation.  So far not trying anything over-the-counter for symptoms.  Last tetanus shot was over 10 years ago per patient.    History reviewed. No pertinent past medical history.  There are no active problems to display for this patient.   Past Surgical History:  Procedure Laterality Date   EXCISION METACARPAL MASS Left 11/13/2020   Procedure: EXCISION MASS, DEBRIDEMENT/SYNOVECTOMY DISTAL INTERPHALANGEAL JOINT;  Surgeon: Lyanne Sample, MD;  Location: [facility];  Service: Orthopedics;  Laterality: Left;   TUBAL LIGATION      OB History   No obstetric history on file.      Home Medications    Prior to Admission medications   Medication Sig Start Date End Date Taking? Authorizing Provider  aspirin EC 81 MG tablet Take 81 mg by mouth daily.   Yes [provider]  Calcium-Vitamins C & D (CALCIUM/C/D PO) Take 1 tablet by mouth daily.   Yes [provider]  chlorhexidine (HIBICLENS) 4 % external liquid Apply topically daily as needed. 12/21/23  Yes Corbin Dess, PA-C  doxycycline (VIBRAMYCIN) 100 MG capsule Take 1 capsule (100 mg total) by mouth 2 (two) times daily. 12/21/23  Yes Corbin Dess, PA-C  Multiple Vitamin (MULTIVITAMIN WITH MINERALS) TABS tablet Take 1 tablet by mouth daily.   Yes [provider]  mupirocin ointment (BACTROBAN) 2 % Apply 1 Application topically 2 (two) times daily. 12/21/23  Yes Corbin Dess, PA-C  fluticasone   (FLONASE ) 50 MCG/ACT nasal spray Place 1 spray into both nostrils 2 (two) times daily. 06/28/22   Corbin Dess, PA-C  promethazine -dextromethorphan (PROMETHAZINE -DM) 6.25-15 MG/5ML syrup Take 5 mLs by mouth 4 (four) times daily as needed. 06/28/22   Corbin Dess, PA-C  traMADol  (ULTRAM ) 50 MG tablet Take 1 tablet (50 mg total) by mouth every 6 (six) hours as needed. 11/13/20   Lyanne Sample, MD    Family History History reviewed. No pertinent family history.  Social History Social History   Tobacco Use   Smoking status: Never   Smokeless tobacco: Never  Vaping Use   Vaping status: Never Used  Substance Use Topics   Alcohol use: Never   Drug use: Never     Allergies   Augmentin [amoxicillin-pot clavulanate] and Azithromycin   Review of Systems Review of Systems HPI  Physical Exam Triage Vital Signs ED Triage Vitals  Encounter Vitals Group     BP 12/21/23 1636 (!) 154/74     Systolic BP Percentile --      Diastolic BP Percentile --      Pulse Rate 12/21/23 1636 79     Resp 12/21/23 1636 16     Temp 12/21/23 1636 98.1 F (36.7 C)     Temp Source 12/21/23 1636 Oral     SpO2 12/21/23 1636 96 %     Weight --      Height --      Head Circumference --  Peak Flow --      Pain Score 12/21/23 1637 1     Pain Loc --      Pain Education --      Exclude from Growth Chart --    No data found.  Updated Vital Signs BP (!) 154/74 (BP Location: Right Arm)   Pulse 79   Temp 98.1 F (36.7 C) (Oral)   Resp 16   SpO2 96%   Visual Acuity Right Eye Distance:   Left Eye Distance:   Bilateral Distance:    Right Eye Near:   Left Eye Near:    Bilateral Near:     Physical Exam Vitals and nursing note reviewed.  Constitutional:      Appearance: Normal appearance. She is not ill-appearing.  HENT:     Head: Atraumatic.  Eyes:     Extraocular Movements: Extraocular movements intact.     Conjunctiva/sclera: Conjunctivae normal.  Cardiovascular:      Rate and Rhythm: Normal rate.  Pulmonary:     Effort: Pulmonary effort is normal.  Musculoskeletal:        General: Tenderness and signs of injury present. Normal range of motion.     Cervical back: Normal range of motion and neck supple.  Skin:    General: Skin is warm.     Comments: V-shaped laceration to the pad of the distal right index finger, wound edges slightly on approximated but no significant gaping on range of motion.  Bleeding well-controlled after pressure dressing  Neurological:     Mental Status: She is alert and oriented to person, place, and time.     Motor: No weakness.     Gait: Gait normal.     Comments: Right upper extremity neurovascularly intact  Psychiatric:        Mood and Affect: Mood normal.        Thought Content: Thought content normal.        Judgment: Judgment normal.      UC Treatments / Results  Labs (all labs ordered are listed, but only abnormal results are displayed) Labs Reviewed - No data to display  EKG   Radiology No results found.  Procedures Procedures (including critical care time)  Medications Ordered in UC Medications  Tdap (BOOSTRIX) injection 0.5 mL (0.5 mLs Intramuscular Given 12/21/23 1726)    Initial Impression / Assessment and Plan / UC Course  I have reviewed the triage vital signs and the nursing notes.  Pertinent labs & imaging results that were available during my care of the patient were reviewed by me and considered in my medical decision making (see chart for details).     Wound treatment options reviewed, pressure dressing and allowing to heal via secondary intention, Dermabond, suture and patient opting for secondary intention with good wound care and pressure dressings at this time.  Wound thoroughly cleaned, dressing applied, and she states she has a finger splint at home that she can apply so does not wish to have one placed here.  Given depth and location we will treat with doxycycline, Hibiclens and  mupirocin for home wound care with dressing instructions reviewed.  Discussed supportive home care, return precautions.  Tdap updated.  Final Clinical Impressions(s) / UC Diagnoses   Final diagnoses:  Laceration of right index finger without foreign body without damage to nail, initial encounter     Discharge Instructions      Clean the area at least once a day with Hibiclens solution and apply the mupirocin  ointment and a nonstick gauze pad.  You may wrap the area in some Coban wrap to hold the dressing in place.  You may apply a finger splint for protection and to keep the area in a neutral position while healing.  I have given a course of antibiotics to ensure the area does not become infected while healing.  Elevate at rest to help with swelling, ibuprofen and Tylenol  as needed for pain.  Follow-up for significantly worsening symptoms at any point.  ED Prescriptions     Medication Sig Dispense Auth. Provider   doxycycline (VIBRAMYCIN) 100 MG capsule Take 1 capsule (100 mg total) by mouth 2 (two) times daily. 14 capsule Corbin Dess, PA-C   chlorhexidine (HIBICLENS) 4 % external liquid Apply topically daily as needed. 236 mL Corbin Dess, PA-C   mupirocin ointment (BACTROBAN) 2 % Apply 1 Application topically 2 (two) times daily. 22 g Corbin Dess, [place]      PDMP not reviewed this encounter.   Corbin Dess, [place] 12/21/23 1732

## 2023-12-21 NOTE — ED Triage Notes (Signed)
 Pt states she cut her right pointer finger on a can of beans today about 1 hour ago.   Pt states her last tetanus shot may be over 10 years ago.
